# Patient Record
Sex: Female | Born: 1990 | Race: White | Hispanic: No | Marital: Single | Smoking: Current every day smoker
Health system: Southern US, Community
[De-identification: ages and names within clinical notes are randomized; demographics above are authoritative.]

## PROBLEM LIST (undated history)

## (undated) DIAGNOSIS — J45909 Unspecified asthma, uncomplicated: Secondary | ICD-10-CM

## (undated) HISTORY — PX: TONSILLECTOMY: SUR1361

## (undated) HISTORY — PX: OTHER SURGICAL HISTORY: SHX169

---

## 2011-04-18 ENCOUNTER — Emergency Department: Payer: Self-pay | Admitting: Internal Medicine

## 2011-04-27 ENCOUNTER — Emergency Department: Payer: Self-pay | Admitting: Unknown Physician Specialty

## 2011-04-27 LAB — BASIC METABOLIC PANEL
Calcium, Total: 8.8 mg/dL (ref 8.5–10.1)
Chloride: 107 mmol/L (ref 98–107)
Co2: 28 mmol/L (ref 21–32)
EGFR (Non-African Amer.): >60
Glucose: 85 mg/dL (ref 65–99)
Osmolality: 277 (ref 275–301)
Potassium: 3.9 mmol/L (ref 3.5–5.1)
Sodium: 140 mmol/L (ref 136–145)

## 2011-04-27 LAB — CBC
HCT: 37.2 % (ref 35.0–47.0)
MCH: 31.7 pg (ref 26.0–34.0)
MCV: 93 fL (ref 80–100)
RDW: 13.3 % (ref 11.5–14.5)
WBC: 6.9 10*3/uL (ref 3.6–11.0)

## 2011-11-22 ENCOUNTER — Emergency Department (HOSPITAL_COMMUNITY)
Admission: EM | Admit: 2011-11-22 | Discharge: 2011-11-23 | Discharge status: Against Medical Advice | Payer: Self-pay | Attending: Emergency Medicine | Admitting: Emergency Medicine

## 2011-11-22 ENCOUNTER — Encounter (HOSPITAL_COMMUNITY): Payer: Self-pay | Admitting: *Deleted

## 2011-11-22 DIAGNOSIS — R112 Nausea with vomiting, unspecified: Secondary | ICD-10-CM | POA: Insufficient documentation

## 2011-11-22 DIAGNOSIS — R197 Diarrhea, unspecified: Secondary | ICD-10-CM | POA: Insufficient documentation

## 2011-11-22 HISTORY — DX: Unspecified asthma, uncomplicated: J45.909

## 2011-11-22 NOTE — ED Notes (Signed)
Pt reports n/v/d since Saturday. Pt tried anti nausea OTC without relief. Pt denies fevers. Pt reports chills with n/v/d.

## 2014-09-30 ENCOUNTER — Encounter (HOSPITAL_BASED_OUTPATIENT_CLINIC_OR_DEPARTMENT_OTHER): Payer: Self-pay | Admitting: *Deleted

## 2014-09-30 ENCOUNTER — Emergency Department (HOSPITAL_BASED_OUTPATIENT_CLINIC_OR_DEPARTMENT_OTHER)
Admission: EM | Admit: 2014-09-30 | Discharge: 2014-09-30 | Disposition: A | Discharge status: Home / Self Care | Payer: Self-pay | Attending: Emergency Medicine | Admitting: Emergency Medicine

## 2014-09-30 DIAGNOSIS — Z880 Allergy status to penicillin: Secondary | ICD-10-CM | POA: Insufficient documentation

## 2014-09-30 DIAGNOSIS — K047 Periapical abscess without sinus: Secondary | ICD-10-CM

## 2014-09-30 DIAGNOSIS — Z720 Tobacco use: Secondary | ICD-10-CM | POA: Insufficient documentation

## 2014-09-30 DIAGNOSIS — K029 Dental caries, unspecified: Secondary | ICD-10-CM | POA: Insufficient documentation

## 2014-09-30 DIAGNOSIS — J45909 Unspecified asthma, uncomplicated: Secondary | ICD-10-CM | POA: Insufficient documentation

## 2014-09-30 MED ORDER — IBUPROFEN 600 MG PO TABS: 600.0000 mg | ORAL_TABLET | Freq: Four times a day (QID) | ORAL | Status: DC | PRN

## 2014-09-30 MED ORDER — CLINDAMYCIN HCL 150 MG PO CAPS: 450.0000 mg | ORAL_CAPSULE | Freq: Once | ORAL | Status: DC

## 2014-09-30 MED ORDER — CLINDAMYCIN HCL 300 MG PO CAPS: 300.0000 mg | ORAL_CAPSULE | Freq: Three times a day (TID) | ORAL | Status: DC

## 2014-09-30 NOTE — ED Provider Notes (Signed)
CSN: [id number]     Arrival date & time 09/30/14  0757 History   First MD Initiated Contact with Patient 09/30/14 [phone]     Chief Complaint  Patient presents with  . Dental Pain     (Consider location/radiation/quality/duration/timing/severity/associated sxs/prior Treatment) HPI Comments: Pt. Is a 24 y/o F with hx of dental abscesses and poor dentition followed by dentistry. She says that she has had worsening left sided mouth pain and swelling over the past two weeks. She says that she has had what feels like a blister on the left side of her mouth as well. She had a fever to 101F overnight relieved by tylenol around midnight. She says that she tried to get an appointment with her dentist but was unable to be seen for two weeks. She otherwise has no nausea, vomiting, drooling, trismus. She has some pain with eating. She has no headache. She denies vision changes, or sensory changes of her face. She has no other medical problems.   The history is provided by the patient.    Past Medical History  Diagnosis Date  . Asthma    Past Surgical History  Procedure Laterality Date  . Tonsillectomy    . Widsom teeth     History reviewed. No pertinent family history. History  Substance Use Topics  . Smoking status: Current Every Day Smoker  . Smokeless tobacco: Not on file  . Alcohol Use: Yes     Comment: occasionally   OB History    No data available     Review of Systems  Constitutional: Positive for fever. Negative for chills, diaphoresis and appetite change.  HENT: Positive for dental problem and facial swelling. Negative for congestion, drooling, ear pain, hearing loss, mouth sores, nosebleeds, postnasal drip, rhinorrhea, sinus pressure, sneezing, sore throat, tinnitus, trouble swallowing and voice change.   Eyes: Negative.  Negative for photophobia, pain and visual disturbance.  Respiratory: Negative for cough, chest tightness and shortness of breath.   Cardiovascular: Negative.   Negative for chest pain.  Gastrointestinal: Negative.  Negative for nausea, vomiting and diarrhea.  Endocrine: Negative.  Negative for cold intolerance and polydipsia.  Genitourinary: Negative.   Musculoskeletal: Negative for back pain, joint swelling, neck pain and neck stiffness.  Skin: Negative.  Negative for rash.  Allergic/Immunologic: Negative.   Neurological: Negative.  Negative for syncope, facial asymmetry, speech difficulty, light-headedness and numbness.  Hematological: Negative.   Psychiatric/Behavioral: Negative.       Allergies  Penicillins  Home Medications   Prior to Admission medications   Medication Sig Start Date End Date Taking? Authorizing Provider  clindamycin (CLEOCIN) 300 MG capsule Take 1 capsule (300 mg total) by mouth 3 (three) times daily. 09/30/14   Yolande Jolly, MD  ibuprofen (ADVIL,MOTRIN) 600 MG tablet Take 1 tablet (600 mg total) by mouth every 6 (six) hours as needed. 09/30/14   Hillery Hunter Quest Tavenner, MD   BP 110/70 mmHg  Pulse 94  Temp(Src) 98.5 F (36.9 C) (Oral)  Resp 14  Ht  (1.651 m)  Wt 95 lb (43.092 kg)  BMI 15.81 kg/m2  SpO2 100% Physical Exam  Constitutional: She appears well-developed and well-nourished. No distress.  HENT:  Head: Normocephalic and atraumatic.  Right Ear: Hearing and external ear normal.  Left Ear: Hearing and external ear normal.  Nose: Nose normal. No mucosal edema, rhinorrhea, nose lacerations or sinus tenderness. Right sinus exhibits no maxillary sinus tenderness and no frontal sinus tenderness. Left sinus exhibits no maxillary sinus tenderness  and no frontal sinus tenderness.  Mouth/Throat: Oropharynx is clear and moist and mucous membranes are normal. She does not have dentures. No oral lesions. No trismus in the jaw. Abnormal dentition. Dental abscesses and dental caries present. No uvula swelling or lacerations. No oropharyngeal exudate, posterior oropharyngeal edema, posterior oropharyngeal erythema or  tonsillar abscesses.    Skin: She is not diaphoretic.    ED Course  Procedures (including critical care time) Labs Review Labs Reviewed - No data to display  Imaging Review No results found.   EKG Interpretation None      MDM   Final diagnoses:  Dental abscess    Pt. Is a 24 y/o otherwise healthy female here with tooth abscess. She needs dental follow up and tooth extraction. Pain and swelling here. Will prescribe Clindamycin 300mg  TID for 10 days along with motrin for pain. She will follow up with her dentist. Other resource contact info given in case she cannot get into her dentist. Follow up with primary care recommended.     Yolande Jolly, MD 09/30/14 1610  Doug Sou, MD 09/30/14 1723

## 2014-09-30 NOTE — ED Provider Notes (Signed)
Complains of left jaw pain for approximately the past one week. Patient states she had a fever last night 101. She treated herself with Tylenol, last dose midnight today. Pain not made better or worse with anything. On exam alert nontoxic HEENT exam poor dentition throughout. Multiple dental caries. No fluctuance of the gingiva. No trismus. Neck supple no cervical lymphadenopathy. Heart regular rate and rhythm non murmurs or rubs  Doug Sou, MD 09/30/14 [phone]

## 2014-09-30 NOTE — ED Notes (Signed)
Pt amb to room 3 with quick steady gait in nad. Pt reports mouth pain with a blister near a broken tooth last week, pt states it is increasing in size.

## 2014-09-30 NOTE — Discharge Instructions (Signed)
Dental Abscess °A dental abscess is a collection of infected fluid (pus) from a bacterial infection in the inner part of the tooth (pulp). It usually occurs at the end of the tooth's root.  °CAUSES  °· Severe tooth decay. °· Trauma to the tooth that allows bacteria to enter into the pulp, such as a broken or chipped tooth. °SYMPTOMS  °· Severe pain in and around the infected tooth. °· Swelling and redness around the abscessed tooth or in the mouth or face. °· Tenderness. °· Pus drainage. °· Bad breath. °· Bitter taste in the mouth. °· Difficulty swallowing. °· Difficulty opening the mouth. °· Nausea. °· Vomiting. °· Chills. °· Swollen neck glands. °DIAGNOSIS  °· A medical and dental history will be taken. °· An examination will be performed by tapping on the abscessed tooth. °· X-rays may be taken of the tooth to identify the abscess. °TREATMENT °The goal of treatment is to eliminate the infection. You may be prescribed antibiotic medicine to stop the infection from spreading. A root canal may be performed to save the tooth. If the tooth cannot be saved, it may be pulled (extracted) and the abscess may be drained.  °HOME CARE INSTRUCTIONS °· Only take over-the-counter or prescription medicines for pain, fever, or discomfort as directed by your caregiver. °· Rinse your mouth (gargle) often with salt water (¼ tsp salt in 8 oz [250 ml] of warm water) to relieve pain or swelling. °· Do not drive after taking pain medicine (narcotics). °· Do not apply heat to the outside of your face. °· Return to your dentist for further treatment as directed. °SEEK MEDICAL CARE IF: °· Your pain is not helped by medicine. °· Your pain is getting worse instead of better. °SEEK IMMEDIATE MEDICAL CARE IF: °· You have a fever or persistent symptoms for more than 2-3 days. °· You have a fever and your symptoms suddenly get worse. °· You have chills or a very bad headache. °· You have problems breathing or swallowing. °· You have trouble  opening your mouth. °· You have swelling in the neck or around the eye. °Document Released: 02/22/2005 Document Revised: 11/17/2011 Document Reviewed: 06/02/2010 °ExitCare® Patient Information ©2015 [employer]. This information is not intended to replace advice given to you by your health care provider. Make sure you discuss any questions you have with your health care provider. ° ° °Emergency Department Resource Guide °1) Find a Doctor and Pay Out of Pocket °Although you won't have to find out who is covered by your insurance plan, it is a good idea to ask around and get recommendations. You will then need to call the office and see if the doctor you have chosen will accept you as a new patient and what types of options they offer for patients who are self-pay. Some doctors offer discounts or will set up payment plans for their patients who do not have insurance, but you will need to ask so you aren't surprised when you get to your appointment. ° °2) Contact Your Local Health Department °Not all health departments have doctors that can see patients for sick visits, but many do, so it is worth a call to see if yours does. If you don't know where your local health department is, you can check in your phone book. The CDC also has a tool to help you locate your state's health department, and many state websites also have listings of all of their local health departments. ° °3) Find a Walk-in   Clinic °If your illness is not likely to be very severe or complicated, you may want to try a walk in clinic. These are popping up all over the country in pharmacies, drugstores, and shopping centers. They're usually staffed by nurse practitioners or physician assistants that have been trained to treat common illnesses and complaints. They're usually fairly quick and inexpensive. However, if you have serious medical issues or chronic medical problems, these are probably not your best option. ° °No Primary Care Doctor: °- Call  [facility] Connect at  832-8000 - they can help you locate a primary care doctor that  accepts your insurance, provides certain services, etc. °- Physician Referral Service- 1-800-533-3463 ° °Chronic Pain Problems: °Organization         Address  Phone   Notes  °[facility]  (336) 297-2271 Patients need to be referred by their primary care doctor.  ° °Medication Assistance: °Organization         Address  Phone   Notes  °[place] [facility] [address] °[place], [place] [address] (336) 641-8030 --Must be a resident of [place] °-- Must have NO insurance coverage whatsoever (no Medicaid/ Medicare, etc.) °-- The pt. MUST have a primary care doctor that directs their care regularly and follows them in the community °  °[facility]  (866) 331-1348   °[employer]  (888) 892-1162   ° °Agencies that provide inexpensive medical care: °Organization         Address  Phone   Notes  °[facility]  (336) 832-8035   °[facility]    (336) 832-7272   °[facility] [address] °[place], [place] [address] (336) 832-4777   °[facility] [address], °[place] (336) 271-4999   °[facility]    (336) 373-0678   °[facility]    (336) 272-1050   °[facility] ° [address], [place] Phone:  (336) 832-4444, Fax:  (336) 832-4440 Hours of Operation:  9 am - 6 pm, M-F.  Also accepts Medicaid/Medicare and self-pay.  °[facility] ° [address], [place] Phone: (336) 832-3150, Fax: (336) 832-3151. Hours of Operation:  8:30 am - 5:30 pm, M-F.  Also accepts Medicaid and self-pay.  °[facility] [place] [address], [place] Phone: (336) 878-6027   °[facility] [address], [place], [place] (336)723-1848, Ext. 123 Mondays & Thursdays: 7-9 AM.  First 15 patients are seen on a first come, first serve  basis. °  ° °Medicaid-accepting [place] Providers: ° °Organization         Address  Phone   Notes  °[facility] [address], [place] (336) 641-2100 Also accepts self-pay patients.  °[facility] [address], [place] ° (336) 856-9996   °[facility] [address], [place] (336) 288-8857   °[facility] [address] [place] [address], [place] (336) 299-7000   °Veita Bland [address], [place]  ° (336) 373-1557 Only accepts [place] Access Medicaid patients after they have their name applied to their card.  ° °Self-Pay (no insurance) in [place]: ° °Organization         Address  Phone   Notes  °Sickle Cell Patients, [facility] [address], [place] (  336) 832-1970   °[facility] [address], [place] (336) 832-4400   ° [facility] ° [address], [place] (336) 992-4800   °[facility]/Dr. Osei-Bonsu ° [address] [place] [address], [place] or [address], [place] (336) 841-8500 Phone number for both [place] and [place] locations is the same.  °[facility] [address], [place] (336) 299-0000   °[facility] [address] [place] [address], [place] or [address] (336) 852-7530 °(336) 878-2260   °[facility] [address], [place] (336) 350-1642, phone; (336) 294-5005, fax Sees patients 1st and 3rd Saturday of every month.  Must not qualify for public or private insurance (i.e. Medicaid, Medicare, [place] Health Choice, Veterans' Benefits) • Household income should be no more than 200% of the poverty level •The clinic cannot treat you if you are pregnant or think you are pregnant • Sexually transmitted diseases are not treated at the clinic.  ° ° °Dental Care: °Organization         Address  Phone  Notes  °[place]  [facility] [address], [place] (336) 641-6152 Accepts children up to age 21 who are enrolled in Medicaid or [place] Health Choice; pregnant women with a Medicaid card; and children who have applied for Medicaid or [place] Health Choice, but were declined, whose parents can pay a reduced fee at time of service.  °[facility] [place]  [address], [place] (336) 641-7733 Accepts children up to age 21 who are enrolled in Medicaid or [place] Health Choice; pregnant women with a Medicaid card; and children who have applied for Medicaid or [place] Health Choice, but were declined, whose parents can pay a reduced fee at time of service.  °[facility] ° [address], [place] (336) 641-4533 Patients are seen by appointment only. Walk-ins are not accepted. [facility] will see patients 18 years of age and older. °Monday - Tuesday (8am-5pm) °Most Wednesdays (8:30-5pm) °$30 per visit, cash only  °[facility] ° [address], [place] (336) 641-4533 Patients are seen by appointment only. Walk-ins are not accepted. [facility] will see patients 18 years of age and older. °One Wednesday Evening (Monthly: Volunteer Based).  $30 per visit, cash only  °[facility]  (919) 537-3737 for adults; Children under age 4, call Graduate Pediatric Dentistry at ([phone]) [phone]. Children aged 4-14, please call (919) [phone] to request a pediatric application. ° Dental services are provided in all areas of dental care including fillings, crowns and bridges, complete and partial dentures, implants, gum treatment, root canals, and extractions. Preventive care is also provided. Treatment is provided to both adults and children. °Patients are selected via a lottery and there is often a waiting list. °  °[facility] [address], °[place] ° (336) 763-8833  [email or web address] °  °[facility] [address], [place], [place] (336)723-1848, Ext. 123 Second and Fourth Thursday of each month, opens at 6:30 AM; Clinic ends at 9 AM.  Patients are seen on a first-come first-served basis, and a limited number are seen during each clinic.  ° °[facility] ° [address], [place], [place] (336) 723-7904   Eligibility Requirements °You must have lived in [place], or [place] counties   for at least the last three months. °  You cannot be eligible for state or federal sponsored healthcare insurance, including Veterans Administration, Medicaid, or Medicare. °  You generally cannot be eligible for healthcare insurance through your employer.  °  How to apply: °Eligibility screenings are held every Tuesday and Wednesday afternoon from 1:00 pm until 4:00 pm. You do not need an appointment for the interview!  °[facility] [address], [place], [place] 336-631-2330   °[facility] [facility]  336-342-8273   °[facility]  336-703-3100   °[facility]  336-570-6415   ° °Behavioral Health Resources in the Community: °Intensive Outpatient Programs °Organization         Address  Phone  Notes  °[facility] [address], [place], [place] 336-878-6098   °[facility] [address], [place], [place] 336-832-9800   °[facility] [address], [place], [place] ° 336-882-2125   °[facility] [address],  °[place], [place] 1-800-853-5163 or 336-641-4981   °Substance Abuse Resources °Organization         Address  Phone  Notes  °[facility]  336-882-2125   °[facility]  336-784-9470   °[facility]  336-285-9073   °[facility]  336-845-3988   °Residential & Outpatient Substance Abuse Program  1-800-659-3381   °Psychological Services °Organization          Address  Phone  Notes  °[facility]  336- 832-9600   °[facility]  336- 378-7881   °[facility] [address], [place] 1-800-853-5163 or 336-641-4981   ° °Mobile Crisis Teams °Organization         Address  Phone  Notes  °[facility], Mobile Crisis Care Unit  1-877-626-1772   °Assertive °[facility] ° [address] [place], [place] 336-834-9664   °Sharon DeEsch [address] °[place] 336-554-5454   ° °Self-Help/Support Groups °Organization         Address  Phone             Notes  °[facility] [place] - variety of support groups  336- 373-1402 Call for more information  °Narcotics Anonymous (NA), [facility] [address], °[place]  2 meetings at this location  ° °Residential Treatment Programs °Organization         Address  Phone  Notes  °[facility] [address],    °[place]  1-866-801-8205   °[facility] ° [address], [place], [place] 704-293-8524   °[facility] [address], [place] 336-845-3988 Admissions: 8am-3pm M-F  °[facility] [address]    °[place], [place] 336-841-1104   °[facility] [address], [place], [place] 336-379-7146   °[facility] [address]  °[place], [place] 336-285-9073   °[facility] Intensive Outpatient [address], [place], [place] 336-852-3033   °[facility] [address]  °[place], [place] 1-877-615-2722 or 336-784-9470   °[facility] [address] [place], [place] 336-227-7417 Accepts Medicaid  °[facility] [address]  °[place]  1-800-659-3381 Substance Abuse/Addiction Treatment  ° °[place] Behavioral Health Resources °Organization         Address  Phone  Notes  °[facility]  (888)   581-9988   °Julie Brannon, PhD [address] [place], [place]   (336) 349-5553 or (336) 951-0000   °[facility]   [address] °[place], [place] (336) 349-4454   °[facility] [address], [place], [place] (336) 342-8316 Insurance/Medicaid/sponsorship through [facility]  °[facility] [address]                                    [place], [place] (336) 342-8316 Therapy/tele-psych/case  °[facility] [address]  ° [place], [place] (336) 349-2233    °Dr. Arfeen  (336) 349-4544   °[facility] [place]  [employer] [place] [facility] 1) [address], [place] °2) [address], [place] °3)  [address], [place] (336) 349-3220 °(336) 342-7768 ° °(336) 342-8140   °[place] [facility] (336) 342-1394 or (336) 342-3537 (After Hours)    ° ° ° °

## 2014-11-27 ENCOUNTER — Emergency Department (HOSPITAL_BASED_OUTPATIENT_CLINIC_OR_DEPARTMENT_OTHER)
Admission: EM | Admit: 2014-11-27 | Discharge: 2014-11-27 | Disposition: A | Discharge status: Home / Self Care | Payer: Self-pay | Attending: Emergency Medicine | Admitting: Emergency Medicine

## 2014-11-27 ENCOUNTER — Emergency Department (HOSPITAL_BASED_OUTPATIENT_CLINIC_OR_DEPARTMENT_OTHER): Admission: EM | Admit: 2014-11-27 | Discharge: 2014-11-27 | Discharge status: Absent without leave | Payer: Self-pay

## 2014-11-27 ENCOUNTER — Encounter (HOSPITAL_BASED_OUTPATIENT_CLINIC_OR_DEPARTMENT_OTHER): Payer: Self-pay

## 2014-11-27 ENCOUNTER — Emergency Department (HOSPITAL_BASED_OUTPATIENT_CLINIC_OR_DEPARTMENT_OTHER): Payer: Self-pay

## 2014-11-27 DIAGNOSIS — R000 Tachycardia, unspecified: Secondary | ICD-10-CM | POA: Insufficient documentation

## 2014-11-27 DIAGNOSIS — Z3202 Encounter for pregnancy test, result negative: Secondary | ICD-10-CM | POA: Insufficient documentation

## 2014-11-27 DIAGNOSIS — R0789 Other chest pain: Secondary | ICD-10-CM | POA: Insufficient documentation

## 2014-11-27 DIAGNOSIS — Z720 Tobacco use: Secondary | ICD-10-CM | POA: Insufficient documentation

## 2014-11-27 DIAGNOSIS — F419 Anxiety disorder, unspecified: Secondary | ICD-10-CM | POA: Insufficient documentation

## 2014-11-27 DIAGNOSIS — J45909 Unspecified asthma, uncomplicated: Secondary | ICD-10-CM | POA: Insufficient documentation

## 2014-11-27 DIAGNOSIS — Z880 Allergy status to penicillin: Secondary | ICD-10-CM | POA: Insufficient documentation

## 2014-11-27 LAB — PREGNANCY, URINE: Preg Test, Ur: NEGATIVE

## 2014-11-27 LAB — BASIC METABOLIC PANEL
Anion gap: 9 (ref 5–15)
BUN: 5 mg/dL — AB (ref 6–20)
CHLORIDE: 108 mmol/L (ref 101–111)
CO2: 24 mmol/L (ref 22–32)
Calcium: 9.6 mg/dL (ref 8.9–10.3)
Creatinine, Ser: 0.58 mg/dL (ref 0.44–1.00)
GFR calc Af Amer: >60 mL/min (ref >60)
GFR calc non Af Amer: >60 mL/min (ref >60)
GLUCOSE: 78 mg/dL (ref 65–99)
POTASSIUM: 3.2 mmol/L — AB (ref 3.5–5.1)
Sodium: 141 mmol/L (ref 135–145)

## 2014-11-27 LAB — CBC WITH DIFFERENTIAL/PLATELET
Basophils Absolute: 0 10*3/uL (ref 0.0–0.1)
Basophils Relative: 0 %
EOS PCT: 1 %
Eosinophils Absolute: 0.1 10*3/uL (ref 0.0–0.7)
HCT: 39.7 % (ref 36.0–46.0)
HEMOGLOBIN: 13.3 g/dL (ref 12.0–15.0)
Lymphocytes Relative: 29 %
Lymphs Abs: 1.5 10*3/uL (ref 0.7–4.0)
MCH: 30.6 pg (ref 26.0–34.0)
MCHC: 33.5 g/dL (ref 30.0–36.0)
MCV: 91.3 fL (ref 78.0–100.0)
Monocytes Absolute: 0.5 10*3/uL (ref 0.1–1.0)
Monocytes Relative: 9 %
NEUTROS ABS: 3.2 10*3/uL (ref 1.7–7.7)
NEUTROS PCT: 61 %
PLATELETS: 200 10*3/uL (ref 150–400)
RBC: 4.35 MIL/uL (ref 3.87–5.11)
RDW: 13.4 % (ref 11.5–15.5)
WBC: 5.2 10*3/uL (ref 4.0–10.5)

## 2014-11-27 LAB — D-DIMER, QUANTITATIVE: D-Dimer, Quant: <0.27 ug/mL-FEU (ref 0.00–0.48)

## 2014-11-27 MED ORDER — HYDROXYZINE HCL 25 MG PO TABS
50.0000 mg | ORAL_TABLET | Freq: Once | ORAL | Status: AC
  Administered 2014-11-27: 50 mg via ORAL
  Filled 2014-11-27: qty 2

## 2014-11-27 MED ORDER — HYDROXYZINE HCL 25 MG PO TABS: 50.0000 mg | ORAL_TABLET | Freq: Four times a day (QID) | ORAL | Status: AC | PRN

## 2014-11-27 MED ORDER — POTASSIUM CHLORIDE CRYS ER 20 MEQ PO TBCR
40.0000 meq | EXTENDED_RELEASE_TABLET | Freq: Once | ORAL | Status: AC
  Administered 2014-11-27: 40 meq via ORAL
  Filled 2014-11-27: qty 2

## 2014-11-27 MED ORDER — SODIUM CHLORIDE 0.9 % IV BOLUS (SEPSIS)
1000.0000 mL | Freq: Once | INTRAVENOUS | Status: AC
  Administered 2014-11-27: 1000 mL via INTRAVENOUS

## 2014-11-27 NOTE — ED Notes (Signed)
CP since 0600-pt NAD-steady gait

## 2014-11-27 NOTE — Discharge Instructions (Signed)
Do not hesitate to return to the emergency room for any new, worsening or concerning symptoms.  Please obtain primary care using resource guide below. Let them know that you were seen in the emergency room and that they will need to obtain records for further outpatient management.   Panic Attacks Panic attacks are sudden, short-livedsurges of severe anxiety, fear, or discomfort. They may occur for no reason when you are relaxed, when you are anxious, or when you are sleeping. Panic attacks may occur for a number of reasons:   Healthy people occasionally have panic attacks in extreme, life-threatening situations, such as war or natural disasters. Normal anxiety is a protective mechanism of the body that helps [place] react to danger (fight or flight response).  Panic attacks are often seen with anxiety disorders, such as panic disorder, social anxiety disorder, generalized anxiety disorder, and phobias. Anxiety disorders cause excessive or uncontrollable anxiety. They may interfere with your relationships or other life activities.  Panic attacks are sometimes seen with other mental illnesses, such as depression and posttraumatic stress disorder.  Certain medical conditions, prescription medicines, and drugs of abuse can cause panic attacks. SYMPTOMS  Panic attacks start suddenly, peak within 20 minutes, and are accompanied by four or more of the following symptoms:  Pounding heart or fast heart rate (palpitations).  Sweating.  Trembling or shaking.  Shortness of breath or feeling smothered.  Feeling choked.  Chest pain or discomfort.  Nausea or strange feeling in your stomach.  Dizziness, light-headedness, or feeling like you will faint.  Chills or hot flushes.  Numbness or tingling in your lips or hands and feet.  Feeling that things are not real or feeling that you are not yourself.  Fear of losing control or going crazy.  Fear of dying. Some of these symptoms can mimic  serious medical conditions. For example, you may think you are having a heart attack. Although panic attacks can be very scary, they are not life threatening. DIAGNOSIS  Panic attacks are diagnosed through an assessment by your health care provider. Your health care provider will ask questions about your symptoms, such as where and when they occurred. Your health care provider will also ask about your medical history and use of alcohol and drugs, including prescription medicines. Your health care provider may order blood tests or other studies to rule out a serious medical condition. Your health care provider may refer you to a mental health professional for further evaluation. TREATMENT   Most healthy people who have one or two panic attacks in an extreme, life-threatening situation will not require treatment.  The treatment for panic attacks associated with anxiety disorders or other mental illness typically involves counseling with a mental health professional, medicine, or a combination of both. Your health care provider will help determine what treatment is best for you.  Panic attacks due to physical illness usually go away with treatment of the illness. If prescription medicine is causing panic attacks, talk with your health care provider about stopping the medicine, decreasing the dose, or substituting another medicine.  Panic attacks due to alcohol or drug abuse go away with abstinence. Some adults need professional help in order to stop drinking or using drugs. HOME CARE INSTRUCTIONS   Take all medicines as directed by your health care provider.   Schedule and attend follow-up visits as directed by your health care provider. It is important to keep all your appointments. SEEK MEDICAL CARE IF:  You are not able to take your  medicines as prescribed.  Your symptoms do not improve or get worse. SEEK IMMEDIATE MEDICAL CARE IF:   You experience panic attack symptoms that are different  than your usual symptoms.  You have serious thoughts about hurting yourself or others.  You are taking medicine for panic attacks and have a serious side effect. MAKE SURE YOU:  Understand these instructions.  Will watch your condition.  Will get help right away if you are not doing well or get worse. Document Released: 02/22/2005 Document Revised: 02/27/2013 Document Reviewed: 10/06/2012 [facility] Patient Information 2015 [place], [place]. This information is not intended to replace advice given to you by your health care provider. Make sure you discuss any questions you have with your health care provider.   Emergency Department Resource Guide 1) Find a Doctor and Pay Out of Pocket Although you won't have to find out who is covered by your insurance plan, it is a good idea to ask around and get recommendations. You will then need to call the office and see if the doctor you have chosen will accept you as a new patient and what types of options they offer for patients who are self-pay. Some doctors offer discounts or will set up payment plans for their patients who do not have insurance, but you will need to ask so you aren't surprised when you get to your appointment.  2) Contact Your Local Health Department Not all health departments have doctors that can see patients for sick visits, but many do, so it is worth a call to see if yours does. If you don't know where your local health department is, you can check in your phone book. The CDC also has a tool to help you locate your state's health department, and many state websites also have listings of all of their local health departments.  3) Find a Walk-in Clinic If your illness is not likely to be very severe or complicated, you may want to try a walk in clinic. These are popping up all over the country in pharmacies, drugstores, and shopping centers. They're usually staffed by nurse practitioners or physician assistants that have been  trained to treat common illnesses and complaints. They're usually fairly quick and inexpensive. However, if you have serious medical issues or chronic medical problems, these are probably not your best option.  No Primary Care Doctor: - Call Health Connect at  [phone] - they can help you locate a primary care doctor that  accepts your insurance, provides certain services, etc. - Physician Referral Service- [phone]  Chronic Pain Problems: Organization         Address  Phone   Notes  [facility]  [phone] Patients need to be referred by their primary care doctor.   Medication Assistance: Organization         Address  Phone   Notes  [facility] Medication [facility] [address] [place] [place], [place] [address] [phone] --Must be a resident of [facility] -- Must have NO insurance coverage whatsoever (no Medicaid/ Medicare, etc.) -- The pt. MUST have a primary care doctor that directs their care regularly and follows them in the community   [facility]  [phone]   [employer]  [phone]    Agencies that provide inexpensive medical care: Organization         Address  Phone   Notes  [facility]  [phone]   [facility]    (  [phone]   [facility] [address] [place], [place] [address] [phone]   [facility] [address] [place], [place] [phone]   Planned Parenthood    [phone]   [facility]    [phone]   [facility]  [address], [place] Phone:  [phone], Fax:  [phone] Hours of Operation:  9 am - 6 pm, M-F.  Also accepts Medicaid/Medicare and self-pay.  [facility]  [address], [place] Phone: [phone], Fax: [phone]. Hours of Operation:  8:30 am - 5:30 pm, M-F.  Also accepts Medicaid and self-pay.   [facility] [place] [address] [place] Phone: [phone]   [facility] [address] Natasha Bence [place], [place] [phone], Ext. [phone] Mondays & Thursdays: 7-9 AM.  First 15 patients are seen on a first come, first serve basis.    Medicaid-accepting [facility] Providers:  Organization         Address  Phone   Notes  [facility] [address], [place] [phone] Also accepts self-pay patients.  [facility] [address] Laurell Josephs [place], [place]  [phone]   [facility] [address], [place] [phone]   [facility] [address] [place] [phone]   Renaye Rakers [address], [place]   [phone] Only accepts [place] Access [place] patients after they have their name applied to their card.   Self-Pay (no insurance) in [facility]:  Organization         Address  Phone   Notes  Sickle Cell Patients, [facility] [address] [place], [place] [phone]   [facility] [address] [place], [place] [phone]   [facility] [place]  [address], [place] [phone]   [facility]/Dr. Osei-Bonsu  [address] [place] or [address], [place] [phone] Phone number for both [place] and [place] locations is the same.  [facility] [address] [place] [phone]   [facility] [address] [place] or [address] [phone]   [facility] [address], [place] [phone], phone; [phone], fax Sees patients 1st and 3rd Saturday of every month.  Must not qualify for public or private insurance (i.e. Medicaid, Medicare, [place] Health Choice, Veterans' Benefits)  Household income should be no  more than 200% of the poverty level The clinic cannot treat you if you are pregnant or think you are pregnant  Sexually transmitted diseases are not treated at the clinic.    Dental Care: Organization         Address  Phone  Notes  [facility] [address] [place], [place] [phone] Accepts children up to age 15 who are enrolled in [place] or [place] Health Choice; pregnant women with a Medicaid card; and children who have applied for Medicaid or  Health Choice, but were declined, whose parents can pay a reduced fee at time of service.  [facility]  [address], [place] [phone] Accepts children up  to age 7 who are enrolled in Medicaid or [place] Health Choice; pregnant women with a Medicaid card; and children who have applied for Medicaid or [place] Health Choice, but were declined, whose parents can pay a reduced fee at time of service.  [facility]  [address] [place], [place] [phone] Patients are seen by appointment only. Walk-ins are not accepted. [facility] will see patients 53 years of age and older. Monday - Tuesday (8am-5pm) Most Wednesdays (8:30-5pm) $30 per visit, cash only  [facility]  [address], [facility] [phone] Patients are seen by appointment only. Walk-ins are not accepted. [facility] will see patients 18 years of age and older. One Wednesday Evening (Monthly: Volunteer Based).  $30 per visit, cash only  [employer]  [phone] for adults; Children under age 15, call Graduate Pediatric Dentistry at [phone]. Children aged 49-14, please call [phone] to request a pediatric application.  Dental services are provided in all areas of dental care including fillings, crowns and bridges, complete and partial dentures, implants, gum treatment, root canals,  and extractions. Preventive care is also provided. Treatment is provided to both adults and children. Patients are selected via a lottery and there is often a waiting list.   [facility] [address], [place]  [phone] [email or web address]   [facility] [address] [place], [place] [phone], Ext. [phone] Second and Fourth Thursday of each month, opens at 6:30 AM; Clinic ends at 9 AM.  Patients are seen on a first-come first-served basis, and a limited number are seen during each clinic.   [facility]  [address] Ether Griffins [place], [place] [phone]   Eligibility Requirements You must have lived in [place], [place], or [place] counties for at least the last three months.   You cannot be eligible for state or federal sponsored [place], including [employer], [place], or [employer].   You generally cannot be eligible for healthcare insurance through your employer.    How to apply: Eligibility screenings are held every Tuesday and Wednesday afternoon from 1:00 pm until 4:00 pm. You do not need an appointment for the interview!  [facility] [address], [place], [place] [phone]   [facility] Health Department  [phone]   [facility] Health Department  [phone]   [facility] Health Department  [phone]    Behavioral Health Resources in the Community: Intensive Outpatient Programs Organization         Address  Phone  Notes  [facility] [address], [place], [place] [phone]   [facility] Outpatient [address], [place], [place] [phone]   [facility] [address] [place], [place]  [phone]   [facility] [address],  [place], [place] [phone] or [phone]   Substance Abuse Resources Organization         Address  Phone  Notes  [facility]  [phone]    [facility]  [phone]   [facility]  [phone]   Floydene Flock  [phone]   Residential & Outpatient Substance Abuse Program  [phone]   Psychological Services Organization         Address  Phone  Notes  [facility] Behavioral Health  [phone]   [facility]  [phone]   [facility]  [address] [place] [phone] or [phone]    Mobile Crisis Teams Organization         Address  Phone  Notes  [facility], Mobile Crisis Care Unit  [phone]   [facility]  [address] [place], [place] [phone]   Doristine Locks [address] [place] [phone]    Self-Help/Support Groups Organization         Address  Phone             Notes  Mental Health Assoc. of  - variety of support groups  336- I7437963 Call for more information  Narcotics Anonymous (NA), [facility] [address], [employer] [place]  2 meetings at this location   Statistician         Address  Phone  Notes  [facility] [address],    [place]  [phone]   [facility]  [address] [place] [address], [place], [place] [phone]   [facility] [address] [place], [place] [phone] Admissions: 8am-3pm M-F  [facility] [address],    [place], [place] [phone]   [facility] [address] [place], [place], [place] [phone]   [facility] [address]  [place], [place] [phone]   [facility] - Intensive Outpatient [address] Laurell Josephs [address], [place], [place] [phone]   [facility] ([facility] [address] [place]  [place], [place] [phone] or [phone]   [facility] ([facility]) [address], [place], [place] [phone] Accepts Medicaid  [facility] [address],    [place] [phone] Substance Abuse/Addiction Treatment   [facility] Organization         Address  Phone  Notes  [facility]  [phone]   Angie Fava, PhD [address] Ervin Knack [place], [place]   [phone] or [phone]   [facility]al   [address] [place], [place] [phone]   [facility] [address] [place], [place] [phone] Insurance/Medicaid/sponsorship through [facility] and Families [address]                                    [place], [place] [phone] Therapy/tele-psych/case  [facility] [address], [place] [phone]    Dr. Lolly Mustache  [phone]   [facility]  [employer] [facility] Dept. 1) [address] [place] 2) [address] [place] 3)  [address], [place] [phone]  [phone]   [facility] Child Abuse Hotline [phone] or [phone] (After Hours)

## 2014-11-27 NOTE — ED Provider Notes (Signed)
CSN: [id number]     Arrival date & time 11/27/14  1233 History   First MD Initiated Contact with Patient 11/27/14 1256     Chief Complaint  Patient presents with  . Chest Pain     (Consider location/radiation/quality/duration/timing/severity/associated sxs/prior Treatment) HPI   Blood pressure 109/72, pulse 102, temperature 98.3 F (36.8 C), temperature source Oral, resp. rate 16, height  (1.651 m), weight 95 lb (43.092 kg), SpO2 100 %.  Marrah Vanevery is a 24 y.o. female with past medical history significant for anxiety, asthma, light tobacco smoker complaining of substernal chest pain nonradiating onset at 6 AM with associated lightheadedness. This is similar to prior anxiety exacerbations, she was encouraged to present for evaluation by people at her work. Patient has dry cough and tactile fever and chills which she associated with an upper respiratory infection because she has nasal congestion. She denies family history of early cardiac, hypertension, hyperlipidemia, diabetes however, she is uninsured and doesn't have a primary care physician. Patient takes the Depo-Provera shot for birth control. She denies any history of DVT or PE, recent immobilizations, calf pain or leg swelling however she states that she had cramps in the left leg which were severe last night. States that her pain is exacerbated with movement and exertion. She denies fever, chills, suicidal ideation, homicidal ideation. Patient states she has a history of panic and anxiety, she states that she lost a child in the last year and this is consistent with prior anxiety attacks. Patient denies any suicidal ideation, homicidal ideation, alcohol or drug abuse.  Past Medical History  Diagnosis Date  . Asthma    Past Surgical History  Procedure Laterality Date  . Tonsillectomy    . [place] teeth     No family history on file. Social History  Substance Use Topics  . Smoking status: Current Every Day Smoker  . Smokeless  tobacco: None  . Alcohol Use: Yes     Comment: occasionally   OB History    No data available     Review of Systems  10 systems reviewed and found to be negative, except as noted in the HPI.   Allergies  Penicillins  Home Medications   Prior to Admission medications   Medication Sig Start Date End Date Taking? Authorizing Provider  hydrOXYzine (ATARAX/VISTARIL) 25 MG tablet Take 2 tablets (50 mg total) by mouth every 6 (six) hours as needed for anxiety. 11/27/14   Nicole Pisciotta, PA-C   BP 110/69 mmHg  Pulse 68  Temp(Src) 98.3 F (36.8 C) (Oral)  Resp 17  Ht  (1.651 m)  Wt 95 lb (43.092 kg)  BMI 15.81 kg/m2  SpO2 100% Physical Exam  Constitutional: She is oriented to person, place, and time. She appears well-developed and well-nourished. No distress.  HENT:  Head: Normocephalic.  Mouth/Throat: Oropharynx is clear and moist.  Eyes: Conjunctivae are normal.  Neck: Normal range of motion. No JVD present. No tracheal deviation present.  Cardiovascular: Regular rhythm and intact distal pulses.   Mild tachycardia.  Radial pulse equal bilaterally  Pulmonary/Chest: Effort normal and breath sounds normal. No stridor. No respiratory distress. She has no wheezes. She has no rales. She exhibits no tenderness.  Abdominal: Soft. She exhibits no distension and no mass. There is no tenderness. There is no rebound and no guarding.  Musculoskeletal: Normal range of motion. She exhibits no edema or tenderness.  No calf asymmetry, superficial collaterals, palpable cords, edema, Homans sign negative bilaterally.  Neurological: She is alert and oriented to person, place, and time.  Skin: Skin is warm. She is not diaphoretic.  Psychiatric: She has a normal mood and affect.  Nursing note and vitals reviewed.   ED Course  Procedures (including critical care time) Labs Review Labs Reviewed  BASIC METABOLIC PANEL - Abnormal; Notable for the following:    Potassium 3.2 (*)     BUN 5 (*)    All other components within normal limits  CBC WITH DIFFERENTIAL/PLATELET  D-DIMER, QUANTITATIVE (NOT AT [facility])  PREGNANCY, URINE    Imaging Review Dg Chest 2 View  11/27/2014   CLINICAL DATA:  Chest pain since this morning with shortness of Breath, history of tobacco use  EXAM: CHEST - 2 VIEW  COMPARISON:  None.  FINDINGS: The heart size and mediastinal contours are within normal limits. Both lungs are clear. The visualized skeletal structures are unremarkable. An extrinsic lead is noted over the right suprahilar region.  IMPRESSION: No active disease.   Electronically Signed   By: Alcide Clever M.D.   On: 11/27/2014 13:02   I have personally reviewed and evaluated these images and lab results as part of my medical decision-making.   EKG Interpretation   Date/Time:  Wednesday November 27 2014 12:35:03 EDT Ventricular Rate:  102 PR Interval:  144 QRS Duration: 82 QT Interval:  334 QTC Calculation: 435 R Axis:   88 Text Interpretation:  Sinus tachycardia Nonspecific T wave abnormality  Abnormal ECG Confirmed by MESNER MD, JASON [phone]) on 11/27/2014 12:44:42  PM      MDM   Final diagnoses:  Atypical chest pain  Anxiety    Filed Vitals:   11/27/14 1235 11/27/14 1445  BP: 109/72 110/69  Pulse: 102 68  Temp: 98.3 F (36.8 C)   TempSrc: Oral   Resp: 16 17  Height:  (1.651 m)   Weight: 95 lb (43.092 kg)   SpO2: 100% 100%    Medications  potassium chloride SA (K-DUR,KLOR-CON) CR tablet 40 mEq (40 mEq Oral Given 11/27/14 1357)  hydrOXYzine (ATARAX/VISTARIL) tablet 50 mg (50 mg Oral Given 11/27/14 1356)  sodium chloride 0.9 % bolus 1,000 mL (0 mLs Intravenous Stopped 11/27/14 1439)    Nataya Bastedo is a pleasant 24 y.o. female presenting with chest pain, she is mildly tachycardic, EKG shows a sinus tachycardia. Doubt this is cardiac in nature. However given her estrogenic birth control and tachycardia we'll have to evaluate her for PE with d-dimer. Patient's  symptoms seem to be coming from her anxiety, patient given Atarax for relief. Blood work reassuring, shows a mild hypokalemia.  Dimer negative and patient reports improvement in symptoms with Atarax. I've encouraged patient to establish primary care and she's been given the resource guide with psychiatric referrals as well. Encouraged her to return to the ED at anytime for new or worsening symptoms.  Evaluation does not show pathology that would require ongoing emergent intervention or inpatient treatment. Pt is hemodynamically stable and mentating appropriately. Discussed findings and plan with patient/guardian, who agrees with care plan. All questions answered. Return precautions discussed and outpatient follow up given.   New Prescriptions   HYDROXYZINE (ATARAX/VISTARIL) 25 MG TABLET    Take 2 tablets (50 mg total) by mouth every 6 (six) hours as needed for anxiety.         Wynetta Emery, PA-C 11/27/14 1515  Marily Memos, MD 11/27/14 [phone]

## 2016-04-10 IMAGING — DX DG CHEST 2V
2 series · 2 of 2 positions shown · non-contrast
Comparison: None.

CLINICAL DATA: Chest pain since this morning with shortness of
Breath, history of tobacco use

EXAM:
CHEST - 2 VIEW

[chest pa]
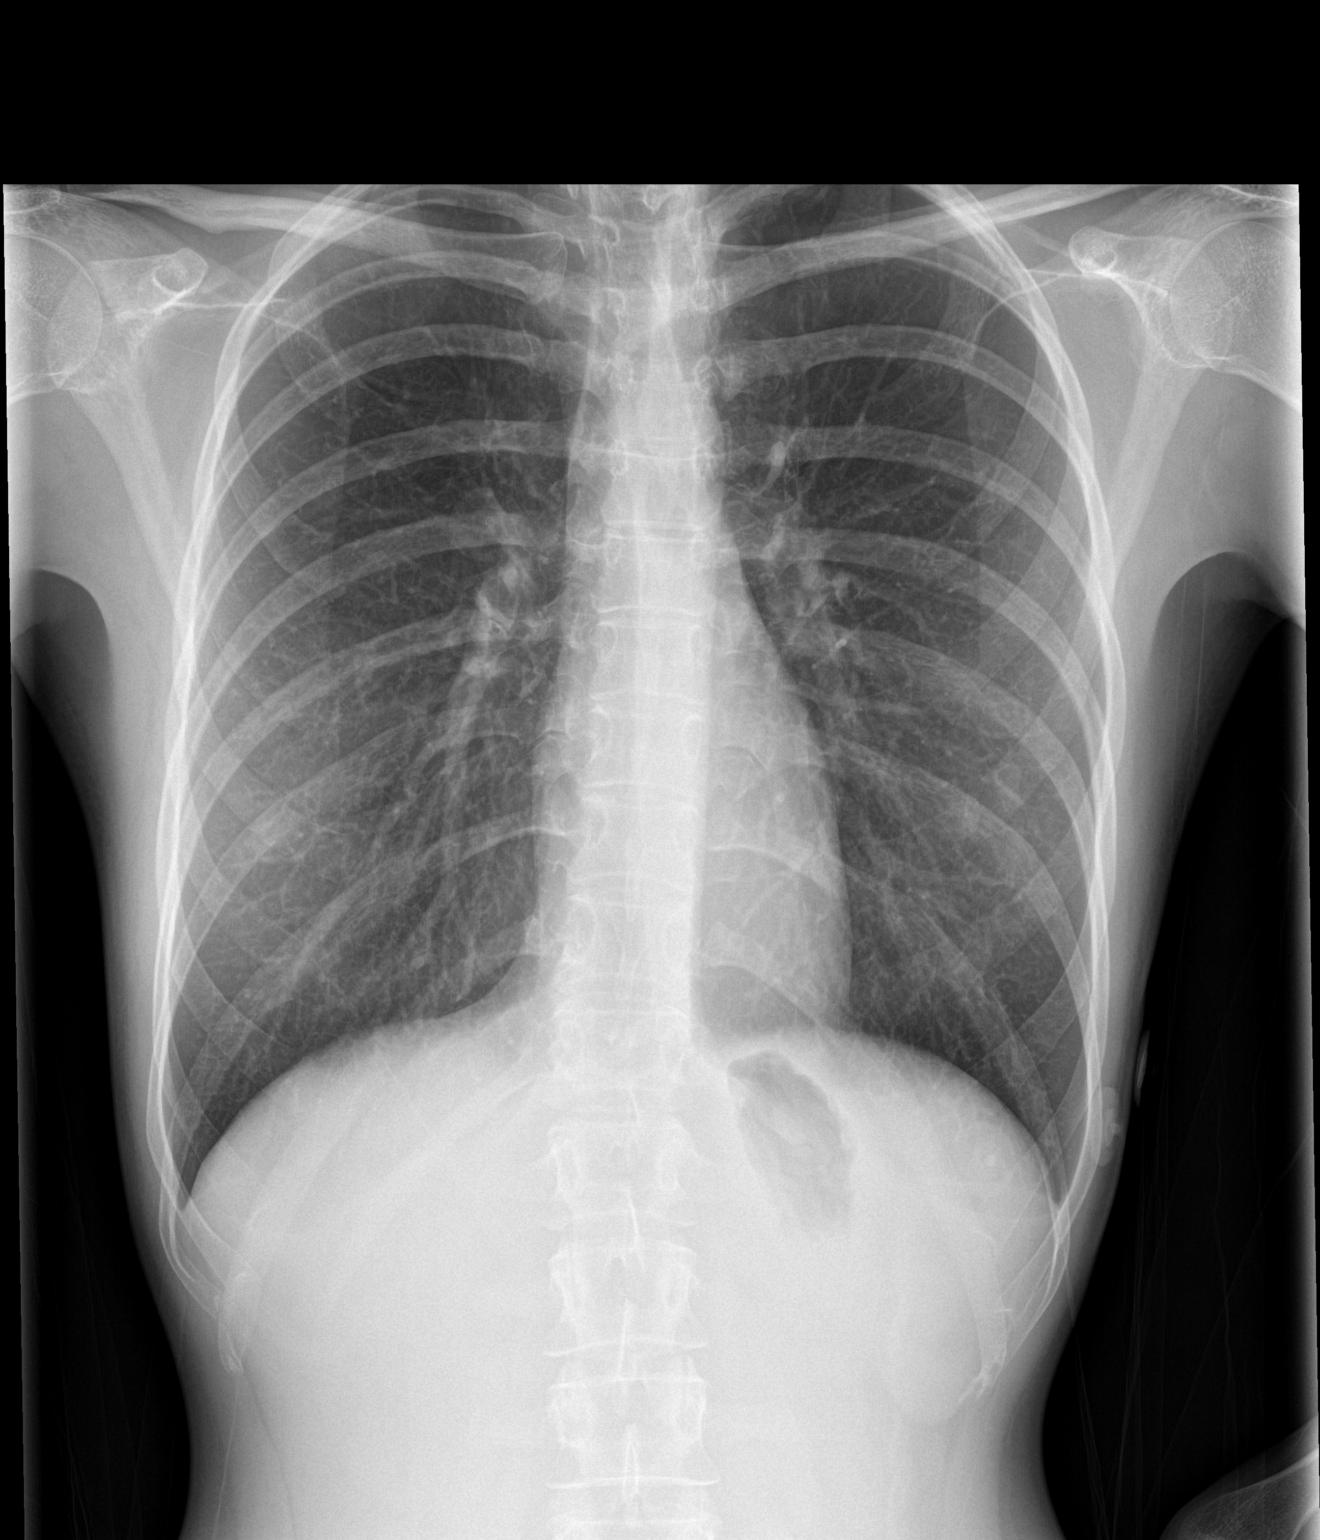

[chest lat]
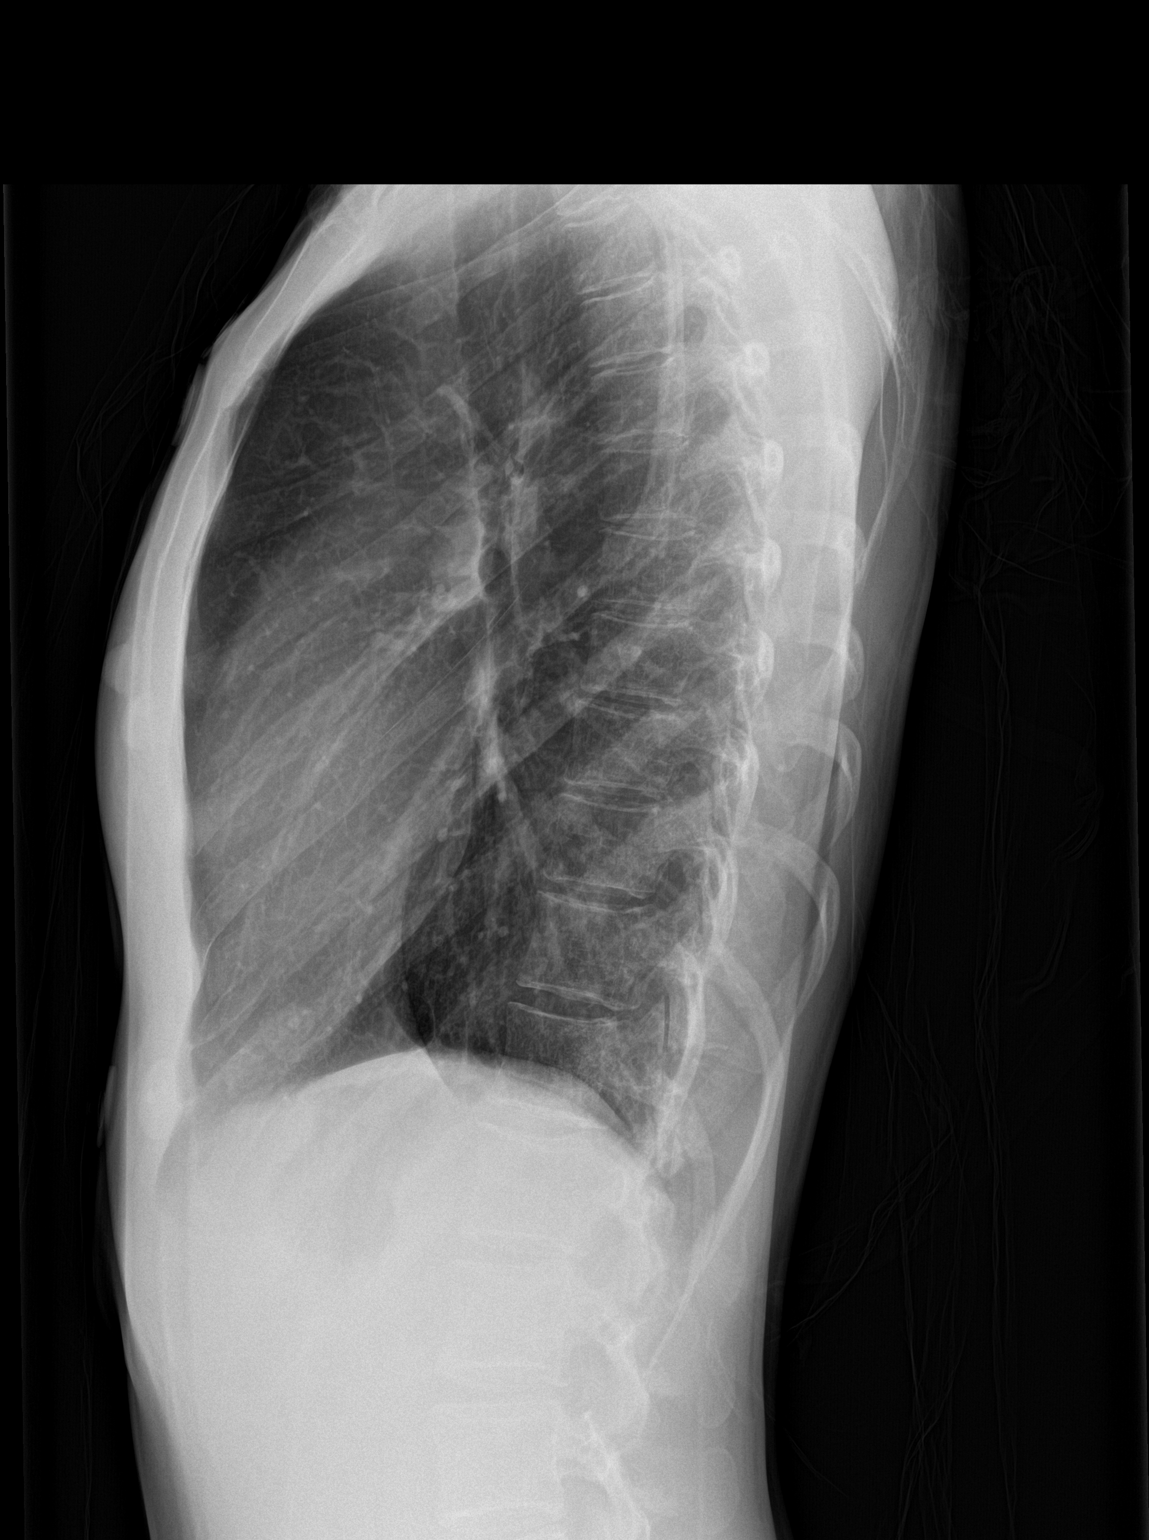

[2 of 2 positions shown; findings below may reference images not displayed]

FINDINGS: The heart size and mediastinal contours are within normal limits.
Both lungs are clear. The visualized skeletal structures are
unremarkable. An extrinsic lead is noted over the right suprahilar
region.
IMPRESSION: No active disease.
